# Patient Record
Sex: Male | Born: 1995 | Race: Black or African American | Hispanic: No | Marital: Single | Smoking: Never smoker
Health system: Southern US, Community
[De-identification: ages and names within clinical notes are randomized; demographics above are authoritative.]

## PROBLEM LIST (undated history)

## (undated) DIAGNOSIS — R5600 Simple febrile convulsions: Secondary | ICD-10-CM

---
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
## 2019-06-13 ENCOUNTER — Emergency Department (HOSPITAL_COMMUNITY): Payer: Self-pay

## 2019-06-13 ENCOUNTER — Emergency Department (HOSPITAL_COMMUNITY)
Admission: EM | Admit: 2019-06-13 | Discharge: 2019-06-13 | Disposition: A | Discharge status: Home / Self Care | Payer: Self-pay | Attending: Emergency Medicine | Admitting: Emergency Medicine

## 2019-06-13 ENCOUNTER — Other Ambulatory Visit: Payer: Self-pay

## 2019-06-13 DIAGNOSIS — Y92410 Unspecified street and highway as the place of occurrence of the external cause: Secondary | ICD-10-CM | POA: Insufficient documentation

## 2019-06-13 DIAGNOSIS — Y939 Activity, unspecified: Secondary | ICD-10-CM | POA: Insufficient documentation

## 2019-06-13 DIAGNOSIS — R52 Pain, unspecified: Secondary | ICD-10-CM

## 2019-06-13 DIAGNOSIS — R1030 Lower abdominal pain, unspecified: Secondary | ICD-10-CM | POA: Insufficient documentation

## 2019-06-13 DIAGNOSIS — M79604 Pain in right leg: Secondary | ICD-10-CM | POA: Insufficient documentation

## 2019-06-13 DIAGNOSIS — Y999 Unspecified external cause status: Secondary | ICD-10-CM | POA: Insufficient documentation

## 2019-06-13 DIAGNOSIS — M25551 Pain in right hip: Secondary | ICD-10-CM | POA: Insufficient documentation

## 2019-06-13 LAB — CBC WITH DIFFERENTIAL/PLATELET
Abs Immature Granulocytes: 0.05 10*3/uL (ref 0.00–0.07)
Basophils Absolute: 0 10*3/uL (ref 0.0–0.1)
Basophils Relative: 0 %
Eosinophils Absolute: 0 10*3/uL (ref 0.0–0.5)
Eosinophils Relative: 0 %
HCT: 45.7 % (ref 39.0–52.0)
Hemoglobin: 15 g/dL (ref 13.0–17.0)
Immature Granulocytes: 0 %
Lymphocytes Relative: 12 %
Lymphs Abs: 1.7 10*3/uL (ref 0.7–4.0)
MCH: 22.9 pg — ABNORMAL LOW (ref 26.0–34.0)
MCHC: 32.8 g/dL (ref 30.0–36.0)
MCV: 69.8 fL — ABNORMAL LOW (ref 80.0–100.0)
Monocytes Absolute: 1 10*3/uL (ref 0.1–1.0)
Monocytes Relative: 7 %
Neutro Abs: 11.3 10*3/uL — ABNORMAL HIGH (ref 1.7–7.7)
Neutrophils Relative %: 81 %
Platelets: 342 10*3/uL (ref 150–400)
RBC: 6.55 MIL/uL — ABNORMAL HIGH (ref 4.22–5.81)
RDW: 16.5 % — ABNORMAL HIGH (ref 11.5–15.5)
WBC: 14.1 10*3/uL — ABNORMAL HIGH (ref 4.0–10.5)
nRBC: 0 % (ref 0.0–0.2)

## 2019-06-13 LAB — PROTIME-INR
INR: 1.1 (ref 0.8–1.2)
Prothrombin Time: 14.5 seconds (ref 11.4–15.2)

## 2019-06-13 LAB — BASIC METABOLIC PANEL
Anion gap: 15 (ref 5–15)
BUN: 17 mg/dL (ref 6–20)
CO2: 19 mmol/L — ABNORMAL LOW (ref 22–32)
Calcium: 10.4 mg/dL — ABNORMAL HIGH (ref 8.9–10.3)
Chloride: 105 mmol/L (ref 98–111)
Creatinine, Ser: 1.88 mg/dL — ABNORMAL HIGH (ref 0.61–1.24)
GFR calc Af Amer: 58 mL/min — ABNORMAL LOW (ref >60)
GFR calc non Af Amer: 50 mL/min — ABNORMAL LOW (ref >60)
Glucose, Bld: 64 mg/dL — ABNORMAL LOW (ref 70–99)
Potassium: 3.2 mmol/L — ABNORMAL LOW (ref 3.5–5.1)
Sodium: 139 mmol/L (ref 135–145)

## 2019-06-13 MED ORDER — IOHEXOL 300 MG/ML  SOLN
100.0000 mL | Freq: Once | INTRAMUSCULAR | Status: AC | PRN
  Administered 2019-06-13: 100 mL via INTRAVENOUS

## 2019-06-13 MED ORDER — HYDROMORPHONE HCL 1 MG/ML IJ SOLN
1.0000 mg | Freq: Once | INTRAMUSCULAR | Status: AC
  Administered 2019-06-13: 1 mg via INTRAVENOUS
  Filled 2019-06-13: qty 1

## 2019-06-13 MED ORDER — SODIUM CHLORIDE 0.9 % IV BOLUS
1000.0000 mL | Freq: Once | INTRAVENOUS | Status: AC
  Administered 2019-06-13: 1000 mL via INTRAVENOUS

## 2019-06-13 NOTE — Discharge Instructions (Addendum)

## 2019-06-13 NOTE — ED Notes (Signed)
Spoke with pt's mother to update.
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
## 2019-06-13 NOTE — ED Notes (Signed)
Pt's mother, Aniceto Boss, notified that pt here in ED. Also advised pt's mother that we will call with updates.

## 2020-01-11 ENCOUNTER — Ambulatory Visit (HOSPITAL_COMMUNITY)
Admission: EM | Admit: 2020-01-11 | Discharge: 2020-01-11 | Disposition: A | Discharge status: Home / Self Care | Payer: Self-pay | Attending: Urgent Care | Admitting: Urgent Care

## 2020-01-11 ENCOUNTER — Encounter (HOSPITAL_COMMUNITY): Payer: Self-pay

## 2020-01-11 ENCOUNTER — Other Ambulatory Visit: Payer: Self-pay

## 2020-01-11 DIAGNOSIS — N50812 Left testicular pain: Secondary | ICD-10-CM | POA: Insufficient documentation

## 2020-01-11 DIAGNOSIS — N451 Epididymitis: Secondary | ICD-10-CM | POA: Insufficient documentation

## 2020-01-11 DIAGNOSIS — R1032 Left lower quadrant pain: Secondary | ICD-10-CM | POA: Insufficient documentation

## 2020-01-11 DIAGNOSIS — N5089 Other specified disorders of the male genital organs: Secondary | ICD-10-CM | POA: Insufficient documentation

## 2020-01-11 DIAGNOSIS — R319 Hematuria, unspecified: Secondary | ICD-10-CM | POA: Insufficient documentation

## 2020-01-11 DIAGNOSIS — R630 Anorexia: Secondary | ICD-10-CM | POA: Insufficient documentation

## 2020-01-11 LAB — POCT URINALYSIS DIP (DEVICE)
Glucose, UA: NEGATIVE mg/dL
Ketones, ur: NEGATIVE mg/dL
Nitrite: NEGATIVE
Protein, ur: 100 mg/dL — AB
Specific Gravity, Urine: >=1.03 (ref 1.005–1.030)
Urobilinogen, UA: 2 mg/dL — ABNORMAL HIGH (ref 0.0–1.0)
pH: 6 (ref 5.0–8.0)

## 2020-01-11 MED ORDER — CEFTRIAXONE SODIUM 1 G IJ SOLR
INTRAMUSCULAR | Status: AC
  Filled 2020-01-11: qty 10

## 2020-01-11 MED ORDER — DOXYCYCLINE HYCLATE 100 MG PO CAPS: 100.0000 mg | ORAL_CAPSULE | Freq: Two times a day (BID) | ORAL | 0 refills | Status: DC

## 2020-01-11 MED ORDER — TAMSULOSIN HCL 0.4 MG PO CAPS: 0.4000 mg | ORAL_CAPSULE | Freq: Every day | ORAL | 0 refills | Status: DC

## 2020-01-11 MED ORDER — TRAMADOL HCL 50 MG PO TABS: 50.0000 mg | ORAL_TABLET | Freq: Four times a day (QID) | ORAL | 0 refills | Status: DC | PRN

## 2020-01-11 MED ORDER — LIDOCAINE HCL (PF) 1 % IJ SOLN
INTRAMUSCULAR | Status: AC
  Filled 2020-01-11: qty 2

## 2020-01-11 MED ORDER — HYDROCODONE-ACETAMINOPHEN 5-325 MG PO TABS
1.0000 | ORAL_TABLET | Freq: Once | ORAL | Status: AC
  Administered 2020-01-11: 12:00:00 1 via ORAL

## 2020-01-11 MED ORDER — HYDROCODONE-ACETAMINOPHEN 5-325 MG PO TABS
ORAL_TABLET | ORAL | Status: AC
  Filled 2020-01-11: qty 1

## 2020-01-11 MED ORDER — CEFTRIAXONE SODIUM 1 G IJ SOLR
1.0000 g | Freq: Once | INTRAMUSCULAR | Status: AC
  Administered 2020-01-11: 1 g via INTRAMUSCULAR

## 2020-01-11 MED ORDER — ONDANSETRON 8 MG PO TBDP: 8.0000 mg | ORAL_TABLET | Freq: Three times a day (TID) | ORAL | 0 refills | Status: DC | PRN

## 2020-01-11 NOTE — ED Triage Notes (Signed)
Pt presents to UC to UC with left groin area pain; pain when urinating and blood spots after urinating.

## 2020-01-11 NOTE — Discharge Instructions (Addendum)
Hydrate well with at least 2 liters (64 ounces) of water daily. Schedule Tylenol 500mg -650mg  every 6 hours for pain. Use Tramadol if your pain is not controlled with Tylenol alone. Once your pain is better go back to regular Tylenol.
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
## 2020-01-13 LAB — URINE CULTURE: Culture: NO GROWTH

## 2020-01-13 LAB — CYTOLOGY, (ORAL, ANAL, URETHRAL) ANCILLARY ONLY
Chlamydia: NEGATIVE
Neisseria Gonorrhea: POSITIVE — AB
Trichomonas: NEGATIVE

## 2020-01-16 ENCOUNTER — Telehealth (HOSPITAL_COMMUNITY): Payer: Self-pay | Admitting: Emergency Medicine

## 2020-01-16 NOTE — Telephone Encounter (Signed)
Test for gonorrhea was positive. This was treated at the urgent care visit with IM rocephin 1000mg . Please refrain from sexual intercourse for 7 days after treatment to give the medicine time to work. Sexual partners need to be notified and tested/treated. Condoms may reduce risk of reinfection. Recheck or followup with PCP for further evaluation if symptoms are not improving. [facility] notified.   Called pt number, he answered the phone but refused to talk to me and hung up. Attempted to call back, pt declined the number and went to voicemail. Left vm to return call. Sending Letter.
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
## 2021-07-06 ENCOUNTER — Other Ambulatory Visit: Payer: Self-pay

## 2021-07-06 ENCOUNTER — Ambulatory Visit (HOSPITAL_COMMUNITY): Admission: EM | Admit: 2021-07-06 | Discharge: 2021-07-06 | Discharge status: Against Medical Advice | Payer: Self-pay

## 2021-07-06 ENCOUNTER — Encounter (HOSPITAL_COMMUNITY): Payer: Self-pay | Admitting: *Deleted

## 2021-07-06 NOTE — ED Notes (Signed)
Provider has gone to patient room 3 different times and patient not in treatment room.  Patient did not tell clinical staff that he was leaving

## 2021-07-06 NOTE — ED Triage Notes (Signed)
Pt reports he works [employer] and has Asthma. Pt has had a cough and asthma Sx's . Pt reports he needs a HHN.

## 2023-10-30 ENCOUNTER — Ambulatory Visit (HOSPITAL_COMMUNITY)
Admission: EM | Admit: 2023-10-30 | Discharge: 2023-10-30 | Disposition: A | Discharge status: Home / Self Care | Payer: Medicaid Other | Attending: Family Medicine | Admitting: Family Medicine

## 2023-10-30 ENCOUNTER — Encounter (HOSPITAL_COMMUNITY): Payer: Self-pay

## 2023-10-30 DIAGNOSIS — K529 Noninfective gastroenteritis and colitis, unspecified: Secondary | ICD-10-CM

## 2023-10-30 MED ORDER — ONDANSETRON 4 MG PO TBDP
ORAL_TABLET | ORAL | Status: AC
  Filled 2023-10-30: qty 1

## 2023-10-30 MED ORDER — ONDANSETRON 4 MG PO TBDP: 4.0000 mg | ORAL_TABLET | Freq: Three times a day (TID) | ORAL | 0 refills | Status: AC | PRN

## 2023-10-30 MED ORDER — ONDANSETRON 4 MG PO TBDP
4.0000 mg | ORAL_TABLET | Freq: Once | ORAL | Status: AC
  Administered 2023-10-30: 4 mg via ORAL

## 2023-10-30 NOTE — Discharge Instructions (Signed)
Ondansetron dissolved in the mouth every 8 hours as needed for nausea or vomiting. Clear liquids(water, gatorade/pedialyte, ginger ale/sprite, chicken broth/soup) and bland things(crackers/toast, rice, potato, bananas) to eat. Avoid acidic foods like lemon/lime/orange/tomato, and avoid greasy/spicy foods.  We gave you 1 dose of this medication here in the clinic

## 2023-10-30 NOTE — ED Triage Notes (Signed)
Patient here today with c/o nausea and vomiting after eating at [facility] 2 days ago. Symptoms started yesterday. Patient was also having some constipation yesterday and took a stool softener and today he has been having some diarrhea.

## 2023-10-30 NOTE — ED Provider Notes (Signed)
[facility]    CSN: [id number] Arrival date & time: 10/30/23  1115      History   Chief Complaint Chief Complaint  Patient presents with   Nausea    HPI Luis Decker is a 27 y.o. male.   HPI Here for nausea and vomiting that began yesterday.  He is thrown up about 6 times since it began but last emesis was yesterday evening. He also has had about 3 watery stools since it began.  No blood in any output.  No fever or chills  He relates that he did eat at a restaurant the day before this started happening and wonders if it relates to that.  No allergies to medications  He is still nauseated but has been managing it with some water and Past Medical History:  Diagnosis Date   Febrile seizure (HCC)     There are no problems to display for this patient.   History reviewed. No pertinent surgical history.     Home Medications    Prior to Admission medications   Medication Sig Start Date End Date Taking? Authorizing Provider  ondansetron (ZOFRAN-ODT) 4 MG disintegrating tablet Take 1 tablet (4 mg total) by mouth every 8 (eight) hours as needed for nausea or vomiting. 10/30/23  Yes Zenia Resides, MD    Family History History reviewed. No pertinent family history.  Social History Social History   Tobacco Use   Smoking status: Never   Smokeless tobacco: Never  Substance Use Topics   Alcohol use: No   Drug use: Yes    Types: Marijuana     Allergies   Patient has no known allergies.   Review of Systems Review of Systems   Physical Exam Triage Vital Signs ED Triage Vitals  Encounter Vitals Group     BP 10/30/23 1300 130/73     Systolic BP Percentile --      Diastolic BP Percentile --      Pulse Rate 10/30/23 1300 (!) 59     Resp 10/30/23 1300 16     Temp 10/30/23 1300 98.1 F (36.7 C)     Temp Source 10/30/23 1300 Oral     SpO2 10/30/23 1300 99 %     Weight 10/30/23 1300 145 lb (65.8 kg)     Height 10/30/23 1300 5\' 10"  (1.778  m)     Head Circumference --      Peak Flow --      Pain Score 10/30/23 1259 7     Pain Loc --      Pain Education --      Exclude from Growth Chart --    No data found.  Updated Vital Signs BP 130/73 (BP Location: Right Arm)   Pulse (!) 59   Temp 98.1 F (36.7 C) (Oral)   Resp 16   Ht 5\' 10"  (1.778 m)   Wt 65.8 kg   SpO2 99%   BMI 20.81 kg/m   Visual Acuity Right Eye Distance:   Left Eye Distance:   Bilateral Distance:    Right Eye Near:   Left Eye Near:    Bilateral Near:     Physical Exam Vitals reviewed.  Constitutional:      General: He is not in acute distress.    Appearance: He is not ill-appearing, toxic-appearing or diaphoretic.  HENT:     Mouth/Throat:     Mouth: Mucous membranes are moist.  Eyes:     Extraocular Movements: Extraocular movements  intact.     Conjunctiva/sclera: Conjunctivae normal.     Pupils: Pupils are equal, round, and reactive to light.  Cardiovascular:     Rate and Rhythm: Normal rate and regular rhythm.     Heart sounds: No murmur heard. Pulmonary:     Effort: Pulmonary effort is normal.     Breath sounds: Normal breath sounds.  Abdominal:     General: There is no distension.     Palpations: Abdomen is soft.     Tenderness: There is no guarding.     Comments: There is some mild generalized tenderness.  Normal active bowel sounds  Musculoskeletal:     Cervical back: Neck supple.  Lymphadenopathy:     Cervical: No cervical adenopathy.  Skin:    Coloration: Skin is not jaundiced or pale.  Neurological:     General: No focal deficit present.     Mental Status: He is alert and oriented to person, place, and time.  Psychiatric:        Behavior: Behavior normal.      UC Treatments / Results  Labs (all labs ordered are listed, but only abnormal results are displayed) Labs Reviewed - No data to display  EKG   Radiology No results found.  Procedures Procedures (including critical care time)  Medications Ordered  in UC Medications  ondansetron (ZOFRAN-ODT) disintegrating tablet 4 mg (has no administration in time range)    Initial Impression / Assessment and Plan / UC Course  I have reviewed the triage vital signs and the nursing notes.  Pertinent labs & imaging results that were available during my care of the patient were reviewed by me and considered in my medical decision making (see chart for details).      1 dose of Zofran is administered here, and a prescription is also sent to the pharmacy.  We discussed bland diet and oral fluids. Final Clinical Impressions(s) / UC Diagnoses   Final diagnoses:  Gastroenteritis     Discharge Instructions      Ondansetron dissolved in the mouth every 8 hours as needed for nausea or vomiting. Clear liquids(water, gatorade/pedialyte, ginger ale/sprite, chicken broth/soup) and bland things(crackers/toast, rice, potato, bananas) to eat. Avoid acidic foods like lemon/lime/orange/tomato, and avoid greasy/spicy foods.  We gave you 1 dose of this medication here in the clinic     ED Prescriptions     Medication Sig Dispense Auth. Provider   ondansetron (ZOFRAN-ODT) 4 MG disintegrating tablet Take 1 tablet (4 mg total) by mouth every 8 (eight) hours as needed for nausea or vomiting. 10 tablet Marlinda Mike Janace Aris, MD      PDMP not reviewed this encounter.   Zenia Resides, MD 10/30/23 [id number]
